# Patient Record
Sex: Female | Born: 1960 | Race: White | Hispanic: No | Marital: Single
Health system: Midwestern US, Community
[De-identification: ages and names within clinical notes are randomized; demographics above are authoritative.]

## PROBLEM LIST (undated history)

## (undated) DIAGNOSIS — F988 Other specified behavioral and emotional disorders with onset usually occurring in childhood and adolescence: Secondary | ICD-10-CM

---

## 2011-06-08 ENCOUNTER — Emergency Department (HOSPITAL_BASED_OUTPATIENT_CLINIC_OR_DEPARTMENT_OTHER)
Admission: EM | Admit: 2011-06-08 | Discharge: 2011-06-08 | Disposition: A | Discharge status: Home / Self Care | Payer: Self-pay | Attending: Emergency Medicine | Admitting: Emergency Medicine

## 2011-06-08 ENCOUNTER — Encounter: Payer: Self-pay | Admitting: Emergency Medicine

## 2011-06-08 DIAGNOSIS — R319 Hematuria, unspecified: Secondary | ICD-10-CM | POA: Insufficient documentation

## 2011-06-08 DIAGNOSIS — F988 Other specified behavioral and emotional disorders with onset usually occurring in childhood and adolescence: Secondary | ICD-10-CM | POA: Insufficient documentation

## 2011-06-08 DIAGNOSIS — Z760 Encounter for issue of repeat prescription: Secondary | ICD-10-CM | POA: Insufficient documentation

## 2011-06-08 DIAGNOSIS — Z8619 Personal history of other infectious and parasitic diseases: Secondary | ICD-10-CM | POA: Insufficient documentation

## 2011-06-08 HISTORY — DX: Other specified behavioral and emotional disorders with onset usually occurring in childhood and adolescence: F98.8

## 2011-06-08 MED ORDER — METHYLPHENIDATE HCL 10 MG PO TABS: 10.0000 mg | ORAL_TABLET | Freq: Two times a day (BID) | ORAL | Status: AC

## 2011-06-08 NOTE — ED Notes (Addendum)
Patient says she has seen worms come out in her feces, urine, on her skin on her breasts, and in her kidneys. She states that she contracted them in October while gardening and has had them ever since. She states she was on a parasitic medicine December.

## 2011-06-08 NOTE — ED Provider Notes (Signed)
Scribed for Shelda Jakes, MD, the patient was seen in room MH02/MH02 . This chart was scribed by Ellie Lunch. This patient's care was started at 5:17 PM.   CSN: [id number] Arrival date & time: 06/08/2011  5:08 PM  No chief complaint on file.  HPI [place] is a 50 y.o. female who presents to the Emergency Department complaining of "Hookworms" in her skin, kidneys, and breasts. Patient reports 3 previous visits to an emergency department in [place] for "Hookworms" between October 2011 and January 2012. She reports being treated with the same oral antiparasitic after each visit, but was not able to have all of her symptoms completely resolved.  2 weeks ago she had one incident of hematuria and c/o kidney pain.   Additionally pt takes Ritalin and requests a refill.     Past Medical History  Diagnosis Date  . ADD (attention deficit disorder)     History reviewed. No pertinent past surgical history. MEDICATIONS:  Previous Medications   METHYLPHENIDATE (RITALIN) 10 MG TABLET    Take 25-30 mg by mouth 3 (three) times daily.       ALLERGIES:  Allergies as of 06/08/2011  . (No Known Allergies)      History reviewed. No pertinent family history. Accompanied to ED by Sister History  Substance Use Topics  . Smoking status: Smoker, Current Status Unknown  . Smokeless tobacco: Not on file  . Alcohol Use: Yes      Review of Systems  Genitourinary: Positive for hematuria (1 incident 2 weeks ago).       Kidney pain  All other systems reviewed and are negative.  10 Systems reviewed and are negative for acute change except as noted in the HPI.   Physical Exam  BP 133/65  Pulse 83  Temp(Src) 97.7 F (36.5 C) (Oral)  Resp 20  SpO2 97%  Physical Exam  Nursing note and vitals reviewed. Constitutional: She is oriented to person, place, and time. She appears well-developed and well-nourished.  HENT:  Head: Normocephalic.  Mouth/Throat: Oropharynx is clear and moist. No  oropharyngeal exudate.  Eyes: Conjunctivae and EOM are normal. No scleral icterus.  Neck: Neck supple.  Cardiovascular: Normal rate, regular rhythm and normal heart sounds.   Pulmonary/Chest: Effort normal and breath sounds normal.  Abdominal: Soft. Bowel sounds are normal. There is no tenderness.  Musculoskeletal: Normal range of motion.  Lymphadenopathy:    She has no cervical adenopathy.  Neurological: She is alert and oriented to person, place, and time. Coordination normal.  Skin: Skin is warm and dry.       Skin abrasions both legs.     OTHER DATA REVIEWED: Nursing notes, vital signs reviewed.   DIAGNOSTIC STUDIES: Oxygen Saturation is 97% on room air, adequate by my interpretation.     MDM:  Fawn Kirk OF UNRESOLVED PARASITIC HOOK WORM INFECTION SINCE October 2011. NEVER PROVED AND ONLY SEEN AT ED IN [facility]. SINCE PATIENT ONLY VISITING HER FOR TODAY ONLY RECOMMENDED GENERAL LAB SCREEN TO INCLUDE UA, CBC, CMP, BUT PATIENT REFUSES ANY TESTING. RECOMMENDED FOLLOW UP AT [facility] VIA ED AND THEN INFECTIOUS DS CLINIC TO CONFIRM DX. PATIENT ON RITALIN AND NEEDED REFILL NO PCM CURRENTLY AND OUT WILL RENEW SMALL AMOUNT. CLINICALLY SEEMS TO HAVE SOME UNDERLYING PSYCHIATRIC PROBLEMS BUT NOT PSYCHOTIC, ? MILD MANIC STATE. IT IS POSSIBLE THAT THE WORM HX IF DELUSIONAL. PE NOT CONVINCING FOR PARASITIC INFECTION.   IMPRESSION: Diagnoses that have been ruled out:  Diagnoses that are still  under consideration:  Final diagnoses:  History of parasitic infection  Attention deficit disorder (ADD)    PLAN:  Home  The patient is to return the emergency department if there is any worsening of symptoms. I have reviewed the discharge instructions with the patient  CONDITION ON DISCHARGE: Stable  MEDICATIONS GIVEN IN THE E.D.  Medications  methylphenidate (RITALIN) 10 MG tablet (not administered)  methylphenidate (RITALIN) 10 MG tablet (not administered)    DISCHARGE  MEDICATIONS: New Prescriptions   METHYLPHENIDATE (RITALIN) 10 MG TABLET    Take 1 tablet (10 mg total) by mouth 2 (two) times daily.    Procedures      I personally performed the services described in this documentation, which was scribed in my presence. The recorded information has been reviewed and considered. Shelda Jakes, MD    Shelda Jakes, MD 06/08/11 [phone]

## 2018-03-17 ENCOUNTER — Other Ambulatory Visit: Payer: Self-pay

## 2018-03-17 ENCOUNTER — Emergency Department (HOSPITAL_COMMUNITY): Payer: Self-pay

## 2018-03-17 ENCOUNTER — Emergency Department (HOSPITAL_COMMUNITY)
Admission: EM | Admit: 2018-03-17 | Discharge: 2018-03-18 | Disposition: A | Discharge status: Home / Self Care | Payer: Self-pay | Attending: Emergency Medicine | Admitting: Emergency Medicine

## 2018-03-17 ENCOUNTER — Encounter (HOSPITAL_COMMUNITY): Payer: Self-pay | Admitting: Behavioral Health

## 2018-03-17 DIAGNOSIS — Z049 Encounter for examination and observation for unspecified reason: Secondary | ICD-10-CM

## 2018-03-17 DIAGNOSIS — F29 Unspecified psychosis not due to a substance or known physiological condition: Secondary | ICD-10-CM | POA: Insufficient documentation

## 2018-03-17 DIAGNOSIS — Z79899 Other long term (current) drug therapy: Secondary | ICD-10-CM | POA: Insufficient documentation

## 2018-03-17 DIAGNOSIS — F4325 Adjustment disorder with mixed disturbance of emotions and conduct: Secondary | ICD-10-CM | POA: Insufficient documentation

## 2018-03-17 DIAGNOSIS — Z046 Encounter for general psychiatric examination, requested by authority: Secondary | ICD-10-CM | POA: Insufficient documentation

## 2018-03-17 LAB — CBC WITH DIFFERENTIAL/PLATELET
Basophils Absolute: 0 10*3/uL (ref 0.0–0.1)
Basophils Relative: 0 %
EOS PCT: 2 %
Eosinophils Absolute: 0.1 10*3/uL (ref 0.0–0.7)
HCT: 43.2 % (ref 36.0–46.0)
Hemoglobin: 14.7 g/dL (ref 12.0–15.0)
Lymphocytes Relative: 21 %
Lymphs Abs: 1.5 10*3/uL (ref 0.7–4.0)
MCH: 28.9 pg (ref 26.0–34.0)
MCHC: 34 g/dL (ref 30.0–36.0)
MCV: 85 fL (ref 78.0–100.0)
MONO ABS: 0.6 10*3/uL (ref 0.1–1.0)
MONOS PCT: 9 %
Neutro Abs: 4.8 10*3/uL (ref 1.7–7.7)
Neutrophils Relative %: 68 %
PLATELETS: 172 10*3/uL (ref 150–400)
RBC: 5.08 MIL/uL (ref 3.87–5.11)
RDW: 12.5 % (ref 11.5–15.5)
WBC: 7 10*3/uL (ref 4.0–10.5)

## 2018-03-17 LAB — URINALYSIS, ROUTINE W REFLEX MICROSCOPIC
BACTERIA UA: NONE SEEN
BILIRUBIN URINE: NEGATIVE
Glucose, UA: NEGATIVE mg/dL
Hgb urine dipstick: NEGATIVE
KETONES UR: NEGATIVE mg/dL
Leukocytes, UA: NEGATIVE
Nitrite: NEGATIVE
PROTEIN: 30 mg/dL — AB
Specific Gravity, Urine: 1.014 (ref 1.005–1.030)
pH: 7 (ref 5.0–8.0)

## 2018-03-17 LAB — COMPREHENSIVE METABOLIC PANEL
ALK PHOS: 101 U/L (ref 38–126)
ALT: 19 U/L (ref 14–54)
ANION GAP: 9 (ref 5–15)
AST: 22 U/L (ref 15–41)
Albumin: 3.9 g/dL (ref 3.5–5.0)
BUN: 12 mg/dL (ref 6–20)
CALCIUM: 9.2 mg/dL (ref 8.9–10.3)
CO2: 25 mmol/L (ref 22–32)
Chloride: 107 mmol/L (ref 101–111)
Creatinine, Ser: 0.54 mg/dL (ref 0.44–1.00)
GFR calc non Af Amer: >60 mL/min (ref >60)
Glucose, Bld: 134 mg/dL — ABNORMAL HIGH (ref 65–99)
POTASSIUM: 4.2 mmol/L (ref 3.5–5.1)
Sodium: 141 mmol/L (ref 135–145)
TOTAL PROTEIN: 6.5 g/dL (ref 6.5–8.1)
Total Bilirubin: 0.7 mg/dL (ref 0.3–1.2)

## 2018-03-17 LAB — RAPID URINE DRUG SCREEN, HOSP PERFORMED
Amphetamines: NOT DETECTED
BENZODIAZEPINES: NOT DETECTED
Barbiturates: NOT DETECTED
COCAINE: NOT DETECTED
OPIATES: NOT DETECTED
Tetrahydrocannabinol: NOT DETECTED

## 2018-03-17 LAB — I-STAT BETA HCG BLOOD, ED (MC, WL, AP ONLY): I-stat hCG, quantitative: <5 m[IU]/mL (ref <5)

## 2018-03-17 LAB — ETHANOL: Alcohol, Ethyl (B): <10 mg/dL (ref <10)

## 2018-03-17 MED ORDER — NICOTINE 21 MG/24HR TD PT24
21.0000 mg | MEDICATED_PATCH | Freq: Every day | TRANSDERMAL | Status: DC
  Filled 2018-03-17 (×2): qty 1

## 2018-03-17 MED ORDER — OLANZAPINE 2.5 MG PO TABS
2.5000 mg | ORAL_TABLET | Freq: Two times a day (BID) | ORAL | Status: DC
  Filled 2018-03-17: qty 1

## 2018-03-17 NOTE — ED Notes (Signed)
Pt refusing to allow this nurse to obtain EKG at this time.

## 2018-03-17 NOTE — BH Assessment (Signed)
[facility] Assessment Progress Note  Per Nanine Means, DNP, this pt requires psychiatric hospitalization at this time.  The following facilities have been contacted to seek placement for this pt, with results as noted:  Beds available, information sent, decision pending:  [facility]   At capacity:  [facility] [place], [place] Behavioral Health Coordinator [phone]

## 2018-03-17 NOTE — ED Provider Notes (Signed)
[facility]-EMERGENCY DEPT Provider Note   CSN: [id number] Arrival date & time: 03/17/18  1209     History   Chief Complaint Chief Complaint  Patient presents with  . IVC  . Medical Clearance    HPI [place] is a 57 y.o. female presenting for psychiatric evaluation.  Level 5 caveat due to psychiatric illness.  Patient here under IVC.  Per IVC paperwork, patient has been very paranoid that there are mites and sewage issues in her house, even though there has been no evidence of this.  She has been living in a motel.  Patient has been interfering with her daughter's care, refusing to give her prescribed medication and giving her instead unknown medication.  Per IVC paperwork, patient assisted her daughter from leaving [place] after she was IVC in [place].   Per patient, she is here because her sister is manipulative.  She denies any psychiatric illness.  She reports that she has mites that are escaping sewage system and going into her house.  HPI  Past Medical History:  Diagnosis Date  . ADD (attention deficit disorder)     There are no active problems to display for this patient.   No past surgical history on file.   OB History   None      Home Medications    Prior to Admission medications   Medication Sig Start Date End Date Taking? Authorizing Provider  methylphenidate (RITALIN) 10 MG tablet Take 1 tablet (10 mg total) by mouth 2 (two) times daily. 06/08/11 03/17/18 Yes Vanetta Mulders, MD    Family History No family history on file.  Social History Social History   Tobacco Use  . Smoking status: Smoker, Current Status Unknown  Substance Use Topics  . Alcohol use: Not Currently  . Drug use: Not Currently     Allergies   Advil [ibuprofen] and Amoxicillin   Review of Systems Review of Systems  Unable to perform ROS: Psychiatric disorder     Physical Exam Updated Vital Signs BP 130/72 (BP Location: Right Arm)    Pulse 79   Temp 98.8 F (37.1 C) (Oral)   Resp 14   SpO2 97%   Physical Exam  Constitutional: She is oriented to person, place, and time. She appears well-developed.  HENT:  Head: Normocephalic and atraumatic.  Eyes: EOM are normal.  Neck: Normal range of motion.  Cardiovascular: Normal rate, regular rhythm and intact distal pulses.  Pulmonary/Chest: Effort normal and breath sounds normal. No respiratory distress. She has no wheezes.  Abdominal: She exhibits no distension.  Musculoskeletal: Normal range of motion.  Neurological: She is alert and oriented to person, place, and time.  Skin: Skin is warm. No rash noted.  Psychiatric: Her speech is rapid and/or pressured and tangential. She is agitated and hyperactive.  Patient behavior is very agitated.  Speech is rapid and pressured.  Tangential thoughts.  Will not always respond to the question, continually circling back to the fact that her sister is manipulative.  Repeatedly reports that she is here because she does not have family support.  Nursing note and vitals reviewed.    ED Treatments / Results  Labs (all labs ordered are listed, but only abnormal results are displayed) Labs Reviewed  COMPREHENSIVE METABOLIC PANEL - Abnormal; Notable for the following components:      Result Value   Glucose, Bld 134 (*)    All other components within normal limits  URINALYSIS, ROUTINE W REFLEX MICROSCOPIC - Abnormal; Notable  for the following components:   Protein, ur 30 (*)    All other components within normal limits  ETHANOL  RAPID URINE DRUG SCREEN, HOSP PERFORMED  CBC WITH DIFFERENTIAL/PLATELET  I-STAT BETA HCG BLOOD, ED ([facility], [facility], [facility] ONLY)    EKG None  Radiology Dg Chest 2 View  Result Date: 03/17/2018 CLINICAL DATA:  Disease ruled out after examination Z04.9 (ICD-10-CM) EXAM: CHEST - 2 VIEW COMPARISON:  None. FINDINGS: The heart size and mediastinal contours are within normal limits. Both lungs are clear. Mild scarring  right apex. The visualized skeletal structures are unremarkable. IMPRESSION: No active cardiopulmonary disease. Electronically Signed   By: Marlan Palau M.D.   On: 03/17/2018 14:45    Procedures Procedures (including critical care time)  Medications Ordered in ED Medications  OLANZapine (ZYPREXA) tablet 2.5 mg (2.5 mg Oral Refused 03/17/18 1524)     Initial Impression / Assessment and Plan / ED Course  I have reviewed the triage vital signs and the nursing notes.  Pertinent labs & imaging results that were available during my care of the patient were reviewed by me and considered in my medical decision making (see chart for details).     Patient presenting under IVC for psychiatric clearance.  Physical exam shows very thin patient with rapid and pressured speech.  Agitated/hyperactive behavior.  Will obtain labs and urine for medical clearance.  Labs and UDS without acute abnormality, pt is medically cleared for TTS eval.   [facility] evaluated the patient, recommends inpatient eval. Pt to be admitted. Will hold off on reordering ritalin at this time, and let [facility] manage pt's psych meds.  Final Clinical Impressions(s) / ED Diagnoses   Final diagnoses:  Psychosis, unspecified psychosis type [facility])    ED Discharge Orders    None       Alveria Apley, PA-C 03/17/18 1615    Mancel Bale, MD 03/18/18 1419

## 2018-03-17 NOTE — ED Notes (Signed)
Pt to room #40. Pt speech pressured,tangential. Pt anxious on approach, delusional. Pt blaming sister for being at the hospital. Encouragement and support provided. Special checks q 15 mins in place for safety, Video monitoring in place. Will continue to monitor.

## 2018-03-17 NOTE — ED Notes (Signed)
TTS at bedside. 

## 2018-03-17 NOTE — ED Notes (Addendum)
Pt remains anxious, restless, paranoia noted.Refusing all offered medications.  Encouragement and support provided. Special checks q 15 mins in place for safety. Will continue to monitor.

## 2018-03-17 NOTE — ED Notes (Signed)
Pt talking on hallway phone.  

## 2018-03-17 NOTE — ED Notes (Signed)
Pt remains anxious, restless, and paranoia. Patient denies SI/HI/AVH at this time. Plan of care discussed. Encouragement and support provided and safety maintain. Q 15 min safety checks remain in place and video monitoring.

## 2018-03-17 NOTE — ED Notes (Signed)
Bed: WBH40 Expected date:  Expected time:  Means of arrival:  Comments: 30 

## 2018-03-17 NOTE — ED Triage Notes (Signed)
Patient arrived with daughter in room 44.  Both were IVC'd by sister when they were found in a [employer].  Patient had helped the patient in room 28 escape IVC in [place] for an unknown mental health issue.  Patient denies any mental issues or medications.  Denies SI, HI, and AVH.  Patient is very tangential in her thoughts and is a poor historian.

## 2018-03-17 NOTE — BH Assessment (Signed)
Assessment Note  [place] is a 57 y.o. female who presented to [facility] under IVC petition (completed by sister, [place]) with complaint of bizarre behavior and possible delusion.  Pt was last treated at [facility] in 2012.  At that time, she complained of ''hookworm infestation.''  Notes indicate that hospital staff suspected delusion.  Pt was transported to the hospital by [employer] with her daughter, who is alo   Per IVC petition:  Respondent has been living in hotel room and her car for the last four months.  She believes there are mites living in her house that fall from the ceiling when she enters it.  She has not lived in her house because of that.  Her daughter was diagnosed with several mental health illnesses and she has been interfering with treatment.  She has prevented her daughter from receiving prescribed medication and has given her unknown medication.  She believes her daughter is improving when in fact her daughter is getting worse.  She is very paranoid and is afraid that if she takes her daughter to a doctor her daughter will be taken from her.  She currently believes that gasoline in [facility] is being watered down because it doesn't run as long as she thinks it should.  Her mother died in 01-03-23.  She is very thin and frail and has a history of anorexia.  Author was unable to contact petitioner, as no phone number was provided.  Pt presented as frail and disheveled.  She reported that she has been living in hotels since the fall because of a mite and scabies infestation in her home in [place].  Pt stated that the mites come in through the water lines and lay eggs.  Pt reported that she came to [facility] yesterday to visit her sister (who resides in [place]), and that sister ''got back at me.''  Pt endorsed the story that her home is infested with mites, and that for this reason, she left the home.  Pt stated that she spends her time caring for her daughter.  When asked  what her daughter needs, Pt stated that her daughter needs Adderal for treatment of ADHD.  Pt stated that she wanted Ritalin for treatment of ADHD, and then to be discharged so she could move to [place].  ''I want to get my daughter on [place] insurance.''  During assessment, Pt presented as alert and oriented.  She had good eye contact and was cooperative.  Pt was in scrubs, and appeared frail and disheveled.  Pt's mood was preoccupied.  Affect was congruent.  Pt denied suicidal ideation, homicidal ideation, hallucination, and self-injurious.  Pt's speech was pressured and tangential.  Thought processes were within normal range, and thought content suggested somatic delusion.  Pt's memory and concentration were fair.  Inisight and judgment were poor.  Impulse control was fair.  Consulted with Molli Knock, NP, who determined that Pt meets inpatient criteria.    Diagnosis: Brief Psychotic Disorder  Past Medical History:  Past Medical History:  Diagnosis Date  . ADD (attention deficit disorder)     No past surgical history on file.  Family History: No family history on file.  Social History:  reports that she has been smoking.  She does not have any smokeless tobacco history on file. She reports that she drank alcohol. She reports that she has current or past drug history.  Additional Social History:  Alcohol / Drug Use Pain Medications: See MAR Prescriptions: See MAR Over  the Counter: See MAR History of alcohol / drug use?: No history of alcohol / drug abuse  CIWA: CIWA-Ar BP: 130/72 Pulse Rate: 79 COWS:    Allergies:  Allergies  Allergen Reactions  . Advil [Ibuprofen] Other (See Comments)    Edema   . Amoxicillin     "it makes me bloat" Has patient had a PCN reaction causing immediate rash, facial/tongue/throat swelling, SOB or lightheadedness with hypotension: No Has patient had a PCN reaction causing severe rash involving mucus membranes or skin necrosis: No Has patient had a  PCN reaction that required hospitalization: No Has patient had a PCN reaction occurring within the last 10 years: No If all of the above answers are "NO", then may proceed with Cephalosporin use.     Home Medications:  (Not in a hospital admission)  OB/GYN Status:  No LMP recorded.  General Assessment Data [facility] of [facility]: [facility] TTS Assessment: In system Is this a Tele or Face-to-Face Assessment?: Face-to-Face Is this an Initial Assessment or a Re-assessment for this encounter?: Initial Assessment Marital status: Single Is patient pregnant?: No Pregnancy Status: No Living Arrangements: Other (Comment)(Hotels, cars) Can pt return to current living arrangement?: Yes Admission Status: Involuntary(Petition completed by sister [place]) Is patient capable of signing voluntary admission?: Yes Referral Source: Self/Family/Friend Insurance type: OOS Medicaid     Crisis Care Plan Living Arrangements: Other (Comment)(Hotels, cars) Name of Psychiatrist: None Name of Therapist: None     Risk to self with the past 6 months Suicidal Ideation: No Has patient been a risk to self within the past 6 months prior to admission? : No Suicidal Intent: No Has patient had any suicidal intent within the past 6 months prior to admission? : No Is patient at risk for suicide?: No Suicidal Plan?: No Has patient had any suicidal plan within the past 6 months prior to admission? : No Access to Means: No Previous Attempts/Gestures: No Intentional Self Injurious Behavior: None Family Suicide History: Unknown Recent stressful life event(s): Other (Comment), Financial Problems(Homeless) Persecutory voices/beliefs?: No Depression: No Substance abuse history and/or treatment for substance abuse?: No Suicide prevention information given to non-admitted patients: Not applicable  Risk to Others within the past 6 months Homicidal Ideation: No Does patient have any lifetime risk of violence  toward others beyond the six months prior to admission? : No Thoughts of Harm to Others: No Current Homicidal Intent: No Current Homicidal Plan: No Access to Homicidal Means: No History of harm to others?: No Assessment of Violence: None Noted Does patient have access to weapons?: No Criminal Charges Pending?: No Does patient have a court date: No Is patient on probation?: No  Psychosis Hallucinations: None noted Delusions: Somatic(Possible somatic delusion - see notes)  Mental Status Report Appearance/Hygiene: Disheveled, Other (Comment)(very thin) Eye Contact: Good Motor Activity: Freedom of movement, Unremarkable Speech: Pressured Level of Consciousness: Alert Mood: Preoccupied, Helpless Affect: Appropriate to circumstance Anxiety Level: Minimal Thought Processes: Coherent, Relevant Judgement: Impaired Orientation: Person, Place, Time, Situation Obsessive Compulsive Thoughts/Behaviors: None  Cognitive Functioning Concentration: Normal Memory: Recent Intact, Remote Intact Is patient IDD: No Is patient DD?: No Insight: Poor Impulse Control: Fair Appetite: Poor Sleep: No Change Vegetative Symptoms: Decreased grooming  ADLScreening [facility] Assessment Services) Patient's cognitive ability adequate to safely complete daily activities?: Yes Patient able to express need for assistance with ADLs?: Yes Independently performs ADLs?: Yes (appropriate for developmental age)  Prior Inpatient Therapy Prior Inpatient Therapy: (Unknown)  Prior Outpatient Therapy Prior Outpatient Therapy: (Unknown)  ADL Screening (condition at time of admission) Patient's cognitive ability adequate to safely complete daily activities?: Yes Is the patient deaf or have difficulty hearing?: No Does the patient have difficulty seeing, even when wearing glasses/contacts?: No Does the patient have difficulty concentrating, remembering, or making decisions?: No Patient able to express need for  assistance with ADLs?: Yes Does the patient have difficulty dressing or bathing?: No Independently performs ADLs?: Yes (appropriate for developmental age) Does the patient have difficulty walking or climbing stairs?: No Weakness of Legs: None Weakness of Arms/Hands: None  Home Assistive Devices/Equipment Home Assistive Devices/Equipment: None  Therapy Consults (therapy consults require a physician order) PT Evaluation Needed: No OT Evalulation Needed: No SLP Evaluation Needed: No Abuse/Neglect Assessment (Assessment to be complete while patient is alone) Abuse/Neglect Assessment Can Be Completed: Yes Physical Abuse: Denies Verbal Abuse: Denies Sexual Abuse: Denies Exploitation of patient/patient's resources: Denies Self-Neglect: Denies Values / Beliefs Cultural Requests During Hospitalization: None Spiritual Requests During Hospitalization: None Consults Spiritual Care Consult Needed: No Social Work Consult Needed: No Merchant navy officer (For Healthcare) Does Patient Have a Medical Advance Directive?: No    Additional Information 1:1 In Past 12 Months?: No CIRT Risk: No Elopement Risk: No Does patient have medical clearance?: Yes     Disposition:  Disposition Initial Assessment Completed for this Encounter: Yes Disposition of Patient: Admit Type of inpatient treatment program: Adult(Per Molli Knock, NP, Pt meets inpt criteria -- gero)  On Site Evaluation by:   Reviewed with Physician:    Dorris Fetch [facility] 03/17/2018 2:33 PM

## 2018-03-18 DIAGNOSIS — F419 Anxiety disorder, unspecified: Secondary | ICD-10-CM

## 2018-03-18 DIAGNOSIS — F4325 Adjustment disorder with mixed disturbance of emotions and conduct: Secondary | ICD-10-CM

## 2018-03-18 DIAGNOSIS — Z818 Family history of other mental and behavioral disorders: Secondary | ICD-10-CM

## 2018-03-18 NOTE — ED Notes (Signed)
Pt was discharged to lobby per order after signing for and receiving belongings/AVS. Pt was agreeable with discharge and in no acute distress. [place]. MHT escorted pt from unit.
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
## 2018-03-18 NOTE — BHH Suicide Risk Assessment (Signed)
Suicide Risk Assessment  Discharge Assessment   [facility] Discharge Suicide Risk Assessment   Principal Problem: Adjustment disorder with mixed disturbance of emotions and conduct Discharge Diagnoses:  Patient Active Problem List   Diagnosis Date Noted  . Adjustment disorder with mixed disturbance of emotions and conduct [F43.25] 03/18/2018    Priority: High    Total Time spent with patient: 45 minutes   Musculoskeletal: Strength & Muscle Tone: within normal limits Gait & Station: normal Patient leans: N/A  Psychiatric Specialty Exam: Physical Exam  Nursing note and vitals reviewed. Constitutional: She is oriented to person, place, and time. She appears well-developed and well-nourished.  HENT:  Head: Normocephalic.  Respiratory: Effort normal.  Musculoskeletal: Normal range of motion.  Neurological: She is alert and oriented to person, place, and time.  Psychiatric: Her speech is normal and behavior is normal. Judgment and thought content normal. Her mood appears anxious. Cognition and memory are normal.    Review of Systems  Psychiatric/Behavioral: The patient is nervous/anxious.   All other systems reviewed and are negative.   Blood pressure (!) 151/81, pulse 83, temperature (!) 97.1 F (36.2 C), resp. rate 20, SpO2 99 %.There is no height or weight on file to calculate BMI.  General Appearance: Casual  Eye Contact:  Good  Speech:  Normal Rate  Volume:  Normal  Mood:  Anxious  Affect:  Congruent  Thought Process:  Coherent and Descriptions of Associations: Intact  Orientation:  Full (Time, Place, and Person)  Thought Content:  WDL and Logical  Suicidal Thoughts:  No  Homicidal Thoughts:  No  Memory:  Immediate;   Good Recent;   Good Remote;   Good  Judgement:  Fair  Insight:  Fair  Psychomotor Activity:  Normal  Concentration:  Concentration: Good and Attention Span: Good  Recall:  Good  Fund of Knowledge:  Good  Language:  Good  Akathisia:  No  Handed:  Right   AIMS (if indicated):     Assets:  Leisure Time Physical Health Resilience Social Support  ADL's:  Intact  Cognition:  WNL  Sleep:       Mental Status Per Nursing Assessment::   On Admission:   delusions   Demographic Factors:  Caucasian  Loss Factors: NA  Historical Factors: NA  Risk Reduction Factors:   Sense of responsibility to family, Living with another person, especially a relative and Positive social support  Continued Clinical Symptoms:  Anxiety   Cognitive Features That Contribute To Risk:  None    Suicide Risk:  Minimal: No identifiable suicidal ideation.  Patients presenting with no risk factors but with morbid ruminations; may be classified as minimal risk based on the severity of the depressive symptoms    Plan Of Care/Follow-up recommendations:  Activity:  as tolerated Diet:  heart healthy diet  Roberta Reever, NP 03/18/2018, 12:49 PM

## 2018-03-18 NOTE — BH Assessment (Signed)
[facility] Assessment Progress Note  Per Juanetta Beets, DO, this pt does not require psychiatric hospitalization at this time.  Pt presents under IVC initiated by pt's sister, which Dr Sharma Covert has rescinded.  Pt is to be discharged from [facility] with recommendation to follow up with [facility].  This has been included in pt's discharge instructions.  Pt's nurse, Clydie Braun, has been notified.  Doylene Canning, MA Triage Specialist [phone]

## 2018-03-18 NOTE — ED Notes (Signed)
Raye denied SI/HI/AVH. She says her family lured her to [place] to set her up and that she doesn't need to be here; in fact, she needs to speak with provider so she can get out to pay her housing bill. Pt reminded of time that providers make rounds.

## 2018-03-18 NOTE — Discharge Instructions (Signed)
For your mental health needs, you are advised to follow up with [facility].  New and returning patients are seen at their walk-in clinic.  Walk-in hours are Monday - Friday from 8:00 am - 3:00 pm.  Walk-in patients are seen on a first come, first served basis.  Try to arrive as early as possible for he best chance of being seen the same day: ° °     [facility] °     [address] °     [place], [place] [address] °     [phone] °

## 2018-03-18 NOTE — ED Notes (Signed)
Patient continues to refuse medication and refuse to let staff do EKG.

## 2018-06-30 DIAGNOSIS — Z5321 Procedure and treatment not carried out due to patient leaving prior to being seen by health care provider: Secondary | ICD-10-CM

## 2018-06-30 NOTE — ED Notes (Signed)
Pt in vending machine area when called for triage, states she does not wish to be seen and is choosing to leave before triage.

## 2018-06-30 NOTE — ED Notes (Signed)
Pt in vending machine area when called for triage, states she does not wish to be seen and is choosing to leave before triage.

## 2018-07-01 ENCOUNTER — Inpatient Hospital Stay: Admit: 2018-07-01 | Discharge: 2018-07-01 | Attending: Emergency Medicine

## 2018-07-03 ENCOUNTER — Inpatient Hospital Stay
Admit: 2018-07-03 | Discharge: 2018-07-03 | Disposition: A | Discharge status: Home / Self Care | Attending: Emergency Medicine

## 2018-07-03 DIAGNOSIS — R05 Cough: Secondary | ICD-10-CM

## 2018-07-03 NOTE — ED Provider Notes (Signed)
HPI   Chief complaint cough.  57 year old smoker with the above concern.  I have met this patient before when caring for her daughter.  Daughter is current inpatient psychiatry patient.  Patient is homeless and living at her car.  They moved up here from [place] hoping by property.  However they may know in roads and supine property yet.  She lives with her 2 dogs in her car.  While her patient is inpatient upstairs patient has been staying in our ER waiting room.  Security asked her to leave as she was there for numerous hours and she was asking staff to change the channel multiple times.  She then decided she needed to be seen for medical reasons so they brought her back to the ER triage area.  She has a nonproductive cough for the past week.  No history of asthma or COPD.  She smokes cigarettes.  Does not use inhalers.  No chest pain. No cardiac history.  No abdominal pain. No fever.  No history of DVT or PE.  No past medical history on file.    No past surgical history on file.      No family history on file.    Social History     Socioeconomic History   ??? Marital status: SINGLE     Spouse name: Not on file   ??? Number of children: Not on file   ??? Years of education: Not on file   ??? Highest education level: Not on file   Occupational History   ??? Not on file   Social Needs   ??? Financial resource strain: Not on file   ??? Food insecurity:     Worry: Not on file     Inability: Not on file   ??? Transportation needs:     Medical: Not on file     Non-medical: Not on file   Tobacco Use   ??? Smoking status: Not on file   Substance and Sexual Activity   ??? Alcohol use: Not on file   ??? Drug use: Not on file   ??? Sexual activity: Not on file   Lifestyle   ??? Physical activity:     Days per week: Not on file     Minutes per session: Not on file   ??? Stress: Not on file   Relationships   ??? Social connections:     Talks on phone: Not on file     Gets together: Not on file     Attends religious service: Not on file     Active member  of club or organization: Not on file     Attends meetings of clubs or organizations: Not on file     Relationship status: Not on file   ??? Intimate partner violence:     Fear of current or ex partner: Not on file     Emotionally abused: Not on file     Physically abused: Not on file     Forced sexual activity: Not on file   Other Topics Concern   ??? Not on file   Social History Narrative   ??? Not on file         ALLERGIES: Patient has no known allergies.    Review of Systems   Review of systems:  Please see HPI.  Otherwise reviewed and negative.    Vitals:    07/03/18 1636   BP: 125/57   Pulse: (!) 103   Resp: 16  Temp: 98.2 ??F (36.8 ??C)   SpO2: 99%   Weight: 49 kg (108 lb)   Height: '5\' 5"'  (1.651 m)            Physical Exam General appearance:   Thin patient sitting up in bed.  She is currently wearing sunglasses inside.  Vital signs reviewed.  No fever.  No hypoxia.  Currently not coughing.     HEENT exam:  Normocephalic atraumatic pupils equally round and reactive to light.  Extraocular muscles intact. Pharynx exam:  No erythema.  No exudate. No trismus.  Neck exam:  Supple, full range of motion.  Lung exam:  Clear to auscultation. No rhonchi.  No crackles.  No retractions.  No wheezes.  Heart exam:  Regular rate rhythm. No murmurs  Abdominal exam:  Nondistended nontender. Bowel sounds are present.  No guarding or rebound.  MSK exam:  Full range of motion all 4 extremities without pain. No visible cellulitis on the exposed skin.  Neurologic exam:  Awake and alert.  Follows commands.  Motor 5/5.  Sensation intact.  Grossly nonfocal.    MDM  Number of Diagnoses or Management Options  Cough:       57 year old here for evaluation of cough.  She has clear lungs.  No hypoxia.  No respiratory distress and not coughing in the ER.  Think she has a URI or viral or allergic issue that is going on intermittently.  I am concerned about her decision making choices as she moved up here to [place] a few months ago hoping to find  housing but she has not succeeded getting housing yet and now her daughter is inpatient psychiatry in the patient's living in her car with her 2 dogs.  She declines behavior health evaluation.  Patient will be discharged home.  No antibiotics are indicated.  I do not think she needs an x-ray either as she has clear lungs and no hypoxia    Procedures      NIH Stroke Scale

## 2018-07-03 NOTE — ED Provider Notes (Signed)
HPI   Chief complaint cough.  58 year old smoker with the above concern.  I have met this patient before when caring for her daughter.  Daughter is current inpatient psychiatry patient.  Patient is homeless and living at her car.  They moved up here from [place] hoping by property.  However they may know in roads and supine property yet.  She lives with her 2 dogs in her car.  While her patient is inpatient upstairs patient has been staying in our ER waiting room.  Security asked her to leave as she was there for numerous hours and she was asking staff to change the channel multiple times.  She then decided she needed to be seen for medical reasons so they brought her back to the ER triage area.  She has a nonproductive cough for the past week.  No history of asthma or COPD.  She smokes cigarettes.  Does not use inhalers.  No chest pain. No cardiac history.  No abdominal pain. No fever.  No history of DVT or PE.  No past medical history on file.    No past surgical history on file.      No family history on file.    Social History     Socioeconomic History   ??? Marital status: SINGLE     Spouse name: Not on file   ??? Number of children: Not on file   ??? Years of education: Not on file   ??? Highest education level: Not on file   Occupational History   ??? Not on file   Social Needs   ??? Financial resource strain: Not on file   ??? Food insecurity:     Worry: Not on file     Inability: Not on file   ??? Transportation needs:     Medical: Not on file     Non-medical: Not on file   Tobacco Use   ??? Smoking status: Not on file   Substance and Sexual Activity   ??? Alcohol use: Not on file   ??? Drug use: Not on file   ??? Sexual activity: Not on file   Lifestyle   ??? Physical activity:     Days per week: Not on file     Minutes per session: Not on file   ??? Stress: Not on file   Relationships   ??? Social connections:     Talks on phone: Not on file     Gets together: Not on file     Attends religious service: Not on file      Active member of club or organization: Not on file     Attends meetings of clubs or organizations: Not on file     Relationship status: Not on file   ??? Intimate partner violence:     Fear of current or ex partner: Not on file     Emotionally abused: Not on file     Physically abused: Not on file     Forced sexual activity: Not on file   Other Topics Concern   ??? Not on file   Social History Narrative   ??? Not on file         ALLERGIES: Patient has no known allergies.    Review of Systems   Review of systems:  Please see HPI.  Otherwise reviewed and negative.    Vitals:    07/03/18 1636   BP: 125/57   Pulse: (!) 103   Resp: 16  Temp: 98.2 ??F (36.8 ??C)   SpO2: 99%   Weight: 49 kg (108 lb)   Height: 5' 5" (1.651 m)            Physical Exam General appearance:   Thin patient sitting up in bed.  She is currently wearing sunglasses inside.  Vital signs reviewed.  No fever.  No hypoxia.  Currently not coughing.     HEENT exam:  Normocephalic atraumatic pupils equally round and reactive to light.  Extraocular muscles intact. Pharynx exam:  No erythema.  No exudate. No trismus.  Neck exam:  Supple, full range of motion.  Lung exam:  Clear to auscultation. No rhonchi.  No crackles.  No retractions.  No wheezes.  Heart exam:  Regular rate rhythm. No murmurs  Abdominal exam:  Nondistended nontender. Bowel sounds are present.  No guarding or rebound.  MSK exam:  Full range of motion all 4 extremities without pain. No visible cellulitis on the exposed skin.  Neurologic exam:  Awake and alert.  Follows commands.  Motor 5/5.  Sensation intact.  Grossly nonfocal.    MDM  Number of Diagnoses or Management Options  Cough:       57 year old here for evaluation of cough.  She has clear lungs.  No hypoxia.  No respiratory distress and not coughing in the ER.  Think she has a URI or viral or allergic issue that is going on intermittently.  I am concerned about her decision making choices as she moved up here to  [place] a few months ago hoping to find housing but she has not succeeded getting housing yet and now her daughter is inpatient psychiatry in the patient's living in her car with her 2 dogs.  She declines behavior health evaluation.  Patient will be discharged home.  No antibiotics are indicated.  I do not think she needs an x-ray either as she has clear lungs and no hypoxia    Procedures      NIH Stroke Scale

## 2018-07-03 NOTE — ED Triage Notes (Signed)
Patient is here for a cough, congestion, and chest discomfort from the cough. She has had the cough for 6 weeks now. Patient has been in the waiting room all day stating that she's here because her daughter is an inpatient. Security asked her to leave because visiting hours were until 530 and she'd been here all day. Patient went to the counter and checked in. A few minutes later she left and security went to get her telling her that if she wanted to be seen that she needed to come in. Patient came back in to be triaged.

## 2018-07-03 NOTE — ED Notes (Signed)
Discharge instructions new medications changes reviewed with patient and caregiver. Patient caregiver states understanding of discharge plan and follow up care. Patient is alert calm and cooperative at time of discharge. patients gait is steady.

## 2018-07-03 NOTE — ED Notes (Signed)
Patient is here for a cough, congestion, and chest discomfort from the cough. She has had the cough for 6 weeks now. Patient has been in the waiting room all day stating that she's here because her daughter is an inpatient. Security asked her to leave because visiting hours were until 530 and she'd been here all day. Patient went to the counter and checked in. A few minutes later she left and security went to get her telling her that if she wanted to be seen that she needed to come in. Patient came back in to be triaged.

## 2019-10-24 IMAGING — CR DG CHEST 2V
2 series · 2 of 2 positions shown · non-contrast
Comparison: None.

CLINICAL DATA: Disease ruled out after examination
([id number])

EXAM:
CHEST - 2 VIEW

[w chest pa]
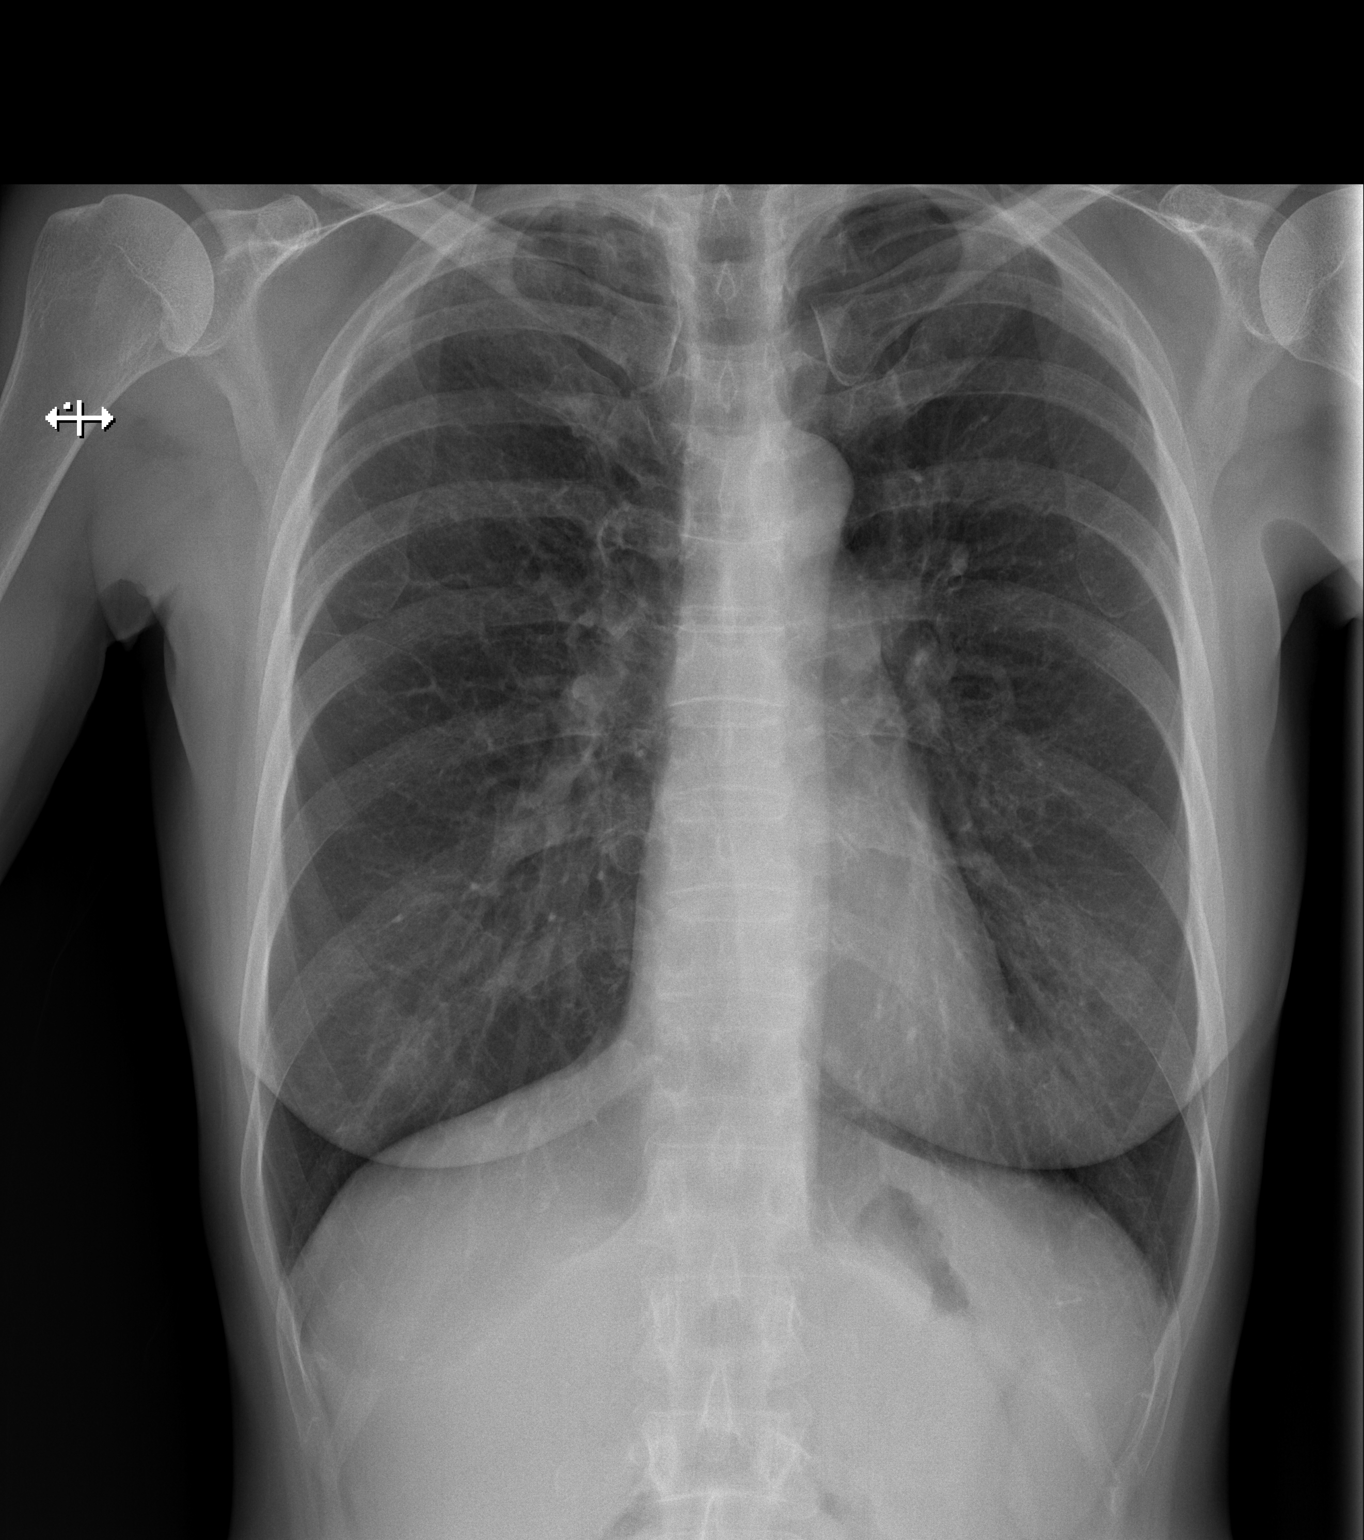

[w chest lat]
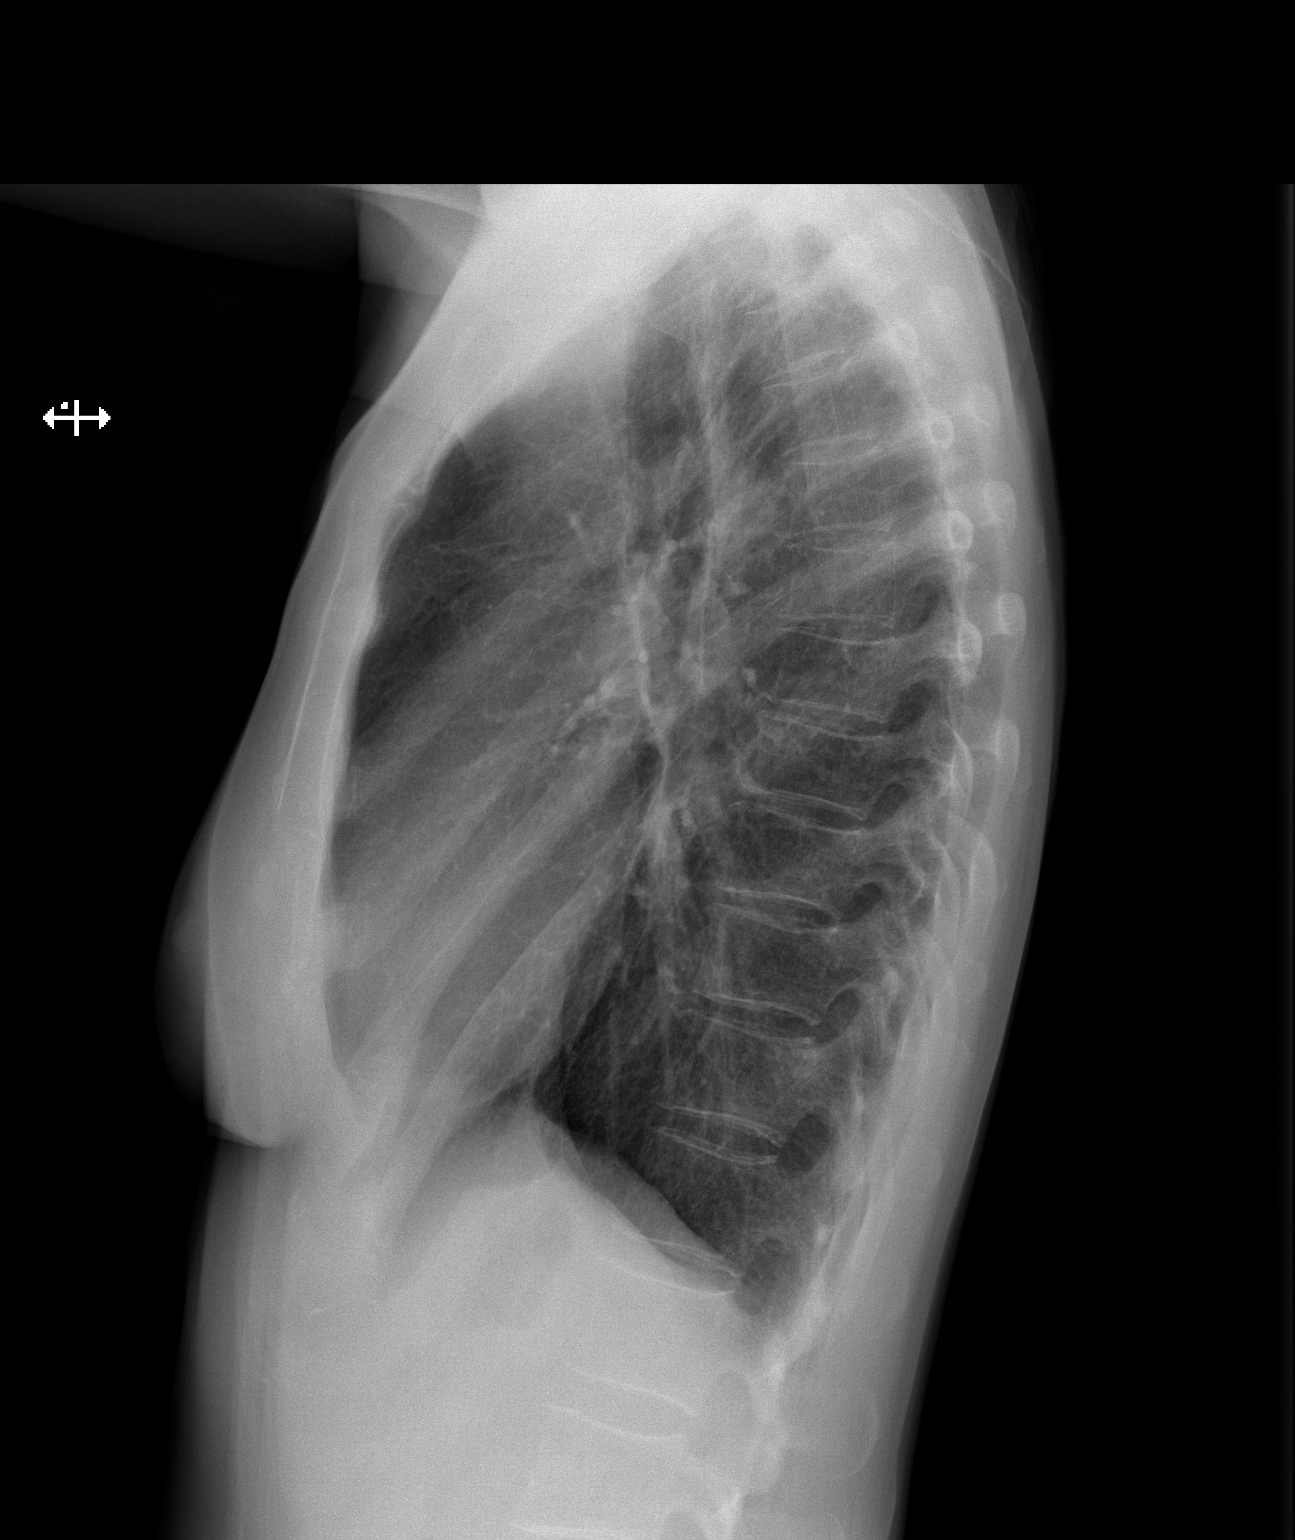

[2 of 2 positions shown; findings below may reference images not displayed]

FINDINGS: The heart size and mediastinal contours are within normal limits.
Both lungs are clear. Mild scarring right apex. The visualized
skeletal structures are unremarkable.
IMPRESSION: No active cardiopulmonary disease.
